# Patient Record
Sex: Male | Born: 2000 | Race: Black or African American | Hispanic: No | Marital: Single | Smoking: Current some day smoker
Health system: Southern US, Community
[De-identification: ages and names within clinical notes are randomized; demographics above are authoritative.]

## PROBLEM LIST (undated history)

## (undated) DIAGNOSIS — J45909 Unspecified asthma, uncomplicated: Secondary | ICD-10-CM

---
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
## 2019-10-11 ENCOUNTER — Emergency Department (HOSPITAL_BASED_OUTPATIENT_CLINIC_OR_DEPARTMENT_OTHER)
Admission: EM | Admit: 2019-10-11 | Discharge: 2019-10-11 | Disposition: A | Discharge status: Home / Self Care | Payer: Medicaid Other | Attending: Emergency Medicine | Admitting: Emergency Medicine

## 2019-10-11 ENCOUNTER — Encounter (HOSPITAL_BASED_OUTPATIENT_CLINIC_OR_DEPARTMENT_OTHER): Payer: Self-pay | Admitting: Emergency Medicine

## 2019-10-11 ENCOUNTER — Other Ambulatory Visit: Payer: Self-pay

## 2019-10-11 DIAGNOSIS — J3489 Other specified disorders of nose and nasal sinuses: Secondary | ICD-10-CM | POA: Insufficient documentation

## 2019-10-11 DIAGNOSIS — R0602 Shortness of breath: Secondary | ICD-10-CM | POA: Diagnosis present

## 2019-10-11 DIAGNOSIS — R0981 Nasal congestion: Secondary | ICD-10-CM | POA: Insufficient documentation

## 2019-10-11 DIAGNOSIS — F17290 Nicotine dependence, other tobacco product, uncomplicated: Secondary | ICD-10-CM | POA: Insufficient documentation

## 2019-10-11 HISTORY — DX: Unspecified asthma, uncomplicated: J45.909

## 2019-10-11 NOTE — ED Provider Notes (Signed)
[facility] EMERGENCY DEPARTMENT Provider Note  CSN: [id number] Arrival date & time: 10/11/19 0111  Chief Complaint(s) Shortness of Breath  HPI Carlos Maxwell is a 19 y.o. male   HPI CC: trouble breathing  Onset/Duration: 2 days Timing: usually at night while lying down Location: nose Severity: moderate Modifying Factors:  Improved by: breathing through mouth, sitting up  Worsened by: lying down Associated Signs/Symptoms:  Pertinent (+): nasal congestion, post nasal drip  Pertinent (-): fevers, chills, wheezing, cough,  Context: no sick contacts  Past Medical History Past Medical History:  Diagnosis Date  . Asthma    There are no problems to display for this patient.  Home Medication(s) Prior to Admission medications   Not on File                                                                                                                                    Past Surgical History History reviewed. No pertinent surgical history. Family History Family History  Problem Relation Age of Onset  . Hypertension Mother   . Colon cancer Maternal Grandmother     Social History Social History   Tobacco Use  . Smoking status: Current Some Day Smoker    Types: Cigars  . Smokeless tobacco: Never Used  Substance Use Topics  . Alcohol use: Never  . Drug use: Never   Allergies Patient has no known allergies.  Review of Systems Review of Systems All other systems are reviewed and are negative for acute change except as noted in the HPI  Physical Exam Vital Signs  I have reviewed the triage vital signs BP 116/80 (BP Location: Left Arm)   Pulse 82   Temp 98.1 F (36.7 C) (Oral)   Resp 16   Ht 5\' 9"  (1.753 m)   Wt 117.5 kg   SpO2 100%   BMI 38.25 kg/m   Physical Exam Vitals reviewed.  Constitutional:      General: He is not in acute distress.    Appearance: He is well-developed. He is not diaphoretic.  HENT:     Head: Normocephalic and  atraumatic.     Nose: Mucosal edema, congestion and rhinorrhea present.     Mouth/Throat:     Mouth: No angioedema.     Pharynx: No pharyngeal swelling, posterior oropharyngeal erythema or uvula swelling.     Tonsils: No tonsillar exudate.     Comments: Post nasal drip Eyes:     General: No scleral icterus.       Right eye: No discharge.        Left eye: No discharge.     Conjunctiva/sclera: Conjunctivae normal.     Pupils: Pupils are equal, round, and reactive to light.  Cardiovascular:     Rate and Rhythm: Normal rate and regular rhythm.     Heart sounds: No murmur. No friction rub. No gallop.   Pulmonary:  Effort: Pulmonary effort is normal. No respiratory distress.     Breath sounds: Normal breath sounds. No stridor. No rales.  Abdominal:     General: There is no distension.     Palpations: Abdomen is soft.     Tenderness: There is no abdominal tenderness.  Musculoskeletal:        General: No tenderness.     Cervical back: Normal range of motion and neck supple.  Skin:    General: Skin is warm and dry.     Findings: No erythema or rash.  Neurological:     Mental Status: He is alert and oriented to person, place, and time.     ED Results and Treatments Labs (all labs ordered are listed, but only abnormal results are displayed) Labs Reviewed - No data to display                                                                                                                       EKG  EKG Interpretation  Date/Time:    Ventricular Rate:    PR Interval:    QRS Duration:   QT Interval:    QTC Calculation:   R Axis:     Text Interpretation:        Radiology No results found.  Pertinent labs & imaging results that were available during my care of the patient were reviewed by me and considered in my medical decision making (see chart for details).  Medications Ordered in ED Medications - No data to display                                                                                                                                   Procedures Procedures  (including critical care time)  Medical Decision Making / ED Course I have reviewed the nursing notes for this encounter and the patient's prior records (if available in EHR or on provided paperwork).   Carlos Maxwell was evaluated in Emergency Department on 10/11/2019 for the symptoms described in the history of present illness. He was evaluated in the context of the global COVID-19 pandemic, which necessitated consideration that the patient might be at risk for infection with the SARS-CoV-2 virus that causes COVID-19. Institutional protocols and algorithms that pertain to the evaluation of patients at risk for COVID-19 are in a state of rapid change based on information released by regulatory bodies including the CDC  and federal and state organizations. These policies and algorithms were followed during the patient's care in the ED.  Trouble breathing through nose at night. Has nasal congestion, likely allergic. No other infectious symptoms. Lungs clear. Recommended OTC allergy medication including nasal steroid spray.      Final Clinical Impression(s) / ED Diagnoses Final diagnoses:  Nasal congestion    The patient appears reasonably screened and/or stabilized for discharge and I doubt any other medical condition or other [facility] requiring further screening, evaluation, or treatment in the ED at this time prior to discharge. Safe for discharge with strict return precautions.  Disposition: Discharge  Condition: Good  I have discussed the results, Dx and Tx plan with the patient/family who expressed understanding and agree(s) with the plan. Discharge instructions discussed at length. The patient/family was given strict return precautions who verbalized understanding of the instructions. No further questions at time of discharge.    ED Discharge Orders    None        Follow  Up: Endocrinology, [facility] [address] Dr Laurell Josephs [address] [place] [address] [phone]  Schedule an appointment as soon as possible for a visit  As needed     This chart was dictated using voice recognition software.  Despite best efforts to proofread,  errors can occur which can change the documentation meaning.   Nira Conn, MD 10/11/19 [phone]

## 2019-10-11 NOTE — ED Triage Notes (Signed)
Pt states he goes to sleep and feels fine but when he wakes up he has shortness of breath  Pt also is c/o rash under his chin from wearing his mask  Mother states she has given him Tylenol cold and flu  Pt has hx of asthma

## 2020-02-28 ENCOUNTER — Encounter (HOSPITAL_BASED_OUTPATIENT_CLINIC_OR_DEPARTMENT_OTHER): Payer: Self-pay

## 2020-02-28 ENCOUNTER — Emergency Department (HOSPITAL_BASED_OUTPATIENT_CLINIC_OR_DEPARTMENT_OTHER)
Admission: EM | Admit: 2020-02-28 | Discharge: 2020-02-28 | Disposition: A | Discharge status: Home / Self Care | Payer: Medicaid Other | Attending: Emergency Medicine | Admitting: Emergency Medicine

## 2020-02-28 ENCOUNTER — Other Ambulatory Visit: Payer: Self-pay

## 2020-02-28 DIAGNOSIS — Z87891 Personal history of nicotine dependence: Secondary | ICD-10-CM | POA: Diagnosis not present

## 2020-02-28 DIAGNOSIS — Z20822 Contact with and (suspected) exposure to covid-19: Secondary | ICD-10-CM | POA: Insufficient documentation

## 2020-02-28 DIAGNOSIS — Z202 Contact with and (suspected) exposure to infections with a predominantly sexual mode of transmission: Secondary | ICD-10-CM | POA: Insufficient documentation

## 2020-02-28 DIAGNOSIS — R059 Cough, unspecified: Secondary | ICD-10-CM | POA: Diagnosis present

## 2020-02-28 DIAGNOSIS — L309 Dermatitis, unspecified: Secondary | ICD-10-CM | POA: Diagnosis not present

## 2020-02-28 DIAGNOSIS — J4521 Mild intermittent asthma with (acute) exacerbation: Secondary | ICD-10-CM | POA: Insufficient documentation

## 2020-02-28 LAB — SARS CORONAVIRUS 2 BY RT PCR (HOSPITAL ORDER, PERFORMED IN ~~LOC~~ HOSPITAL LAB): SARS Coronavirus 2: NEGATIVE

## 2020-02-28 MED ORDER — PREDNISONE 50 MG PO TABS
60.0000 mg | ORAL_TABLET | Freq: Once | ORAL | Status: AC
  Administered 2020-02-28: 60 mg via ORAL
  Filled 2020-02-28: qty 1

## 2020-02-28 MED ORDER — HYDROCORTISONE 2.5 % EX LOTN: TOPICAL_LOTION | Freq: Two times a day (BID) | CUTANEOUS | 0 refills | Status: DC

## 2020-02-28 MED ORDER — PREDNISONE 20 MG PO TABS: ORAL_TABLET | ORAL | 0 refills | Status: DC

## 2020-02-28 MED ORDER — AZITHROMYCIN 1 G PO PACK
1.0000 g | PACK | Freq: Once | ORAL | Status: AC
  Administered 2020-02-28: 1 g via ORAL
  Filled 2020-02-28: qty 1

## 2020-02-28 MED ORDER — ALBUTEROL SULFATE HFA 108 (90 BASE) MCG/ACT IN AERS
2.0000 | INHALATION_SPRAY | Freq: Once | RESPIRATORY_TRACT | Status: AC
  Administered 2020-02-28: 2 via RESPIRATORY_TRACT
  Filled 2020-02-28: qty 6.7

## 2020-02-28 NOTE — ED Provider Notes (Signed)
[facility] EMERGENCY DEPARTMENT Provider Note   CSN: [id number] Arrival date & time: 02/28/20  2016     History Chief Complaint  Patient presents with  . Cough    Carlos Maxwell is a 19 y.o. male hx of asthma here presenting with multiple complaints.  Patient states that he ran out of his albuterol several weeks ago and has been having nonproductive cough and wheezing for the last week.  Patient states that he is vaccinated with [facility] vaccine and has no Covid exposure.  Patient also states that he needs to get tested for chlamydia.  He states that his girlfriend was diagnosed with chlamydia and was treated but he is concerned that he may have as well.  He denies any penile discharge or dysuria or penile rash.  Finally, patient states that the right side of his beard appears abnormal for the last year or so.  He states that the skin just has not grow back normally.  The history is provided by the patient.       Past Medical History:  Diagnosis Date  . Asthma     There are no problems to display for this patient.   History reviewed. No pertinent surgical history.     Family History  Problem Relation Age of Onset  . Hypertension Mother   . Colon cancer Maternal Grandmother     Social History   Tobacco Use  . Smoking status: Former Smoker    Types: Cigars  . Smokeless tobacco: Never Used  Vaping Use  . Vaping Use: Every day  Substance Use Topics  . Alcohol use: Never  . Drug use: Never    Home Medications Prior to Admission medications   Not on File    Allergies    Patient has no known allergies.  Review of Systems   Review of Systems  Respiratory: Positive for cough.   Skin: Positive for rash.  All other systems reviewed and are negative.   Physical Exam Updated Vital Signs BP 112/66 (BP Location: Left Arm)   Pulse 94   Temp 98.2 F (36.8 C) (Oral)   Resp 18   Ht 5\' 8"  (1.727 m)   Wt 124.3 kg   SpO2 98%   BMI 41.66  kg/m   Physical Exam Vitals and nursing note reviewed.  HENT:     Head: Normocephalic.     Nose: Nose normal.     Mouth/Throat:     Mouth: Mucous membranes are moist.  Eyes:     Extraocular Movements: Extraocular movements intact.     Pupils: Pupils are equal, round, and reactive to light.  Neck:     Comments: Right side beard with evidence of eczema.  No obvious ringworm. Cardiovascular:     Rate and Rhythm: Normal rate and regular rhythm.     Pulses: Normal pulses.     Heart sounds: Normal heart sounds.  Pulmonary:     Comments: Diminished throughout and no wheezing or crackles Abdominal:     General: Abdomen is flat.     Palpations: Abdomen is soft.  Musculoskeletal:        General: Normal range of motion.     Cervical back: Normal range of motion.  Skin:    General: Skin is warm.     Capillary Refill: Capillary refill takes less than 2 seconds.  Neurological:     General: No focal deficit present.     Mental Status: He is alert and oriented  to person, place, and time.  Psychiatric:        Mood and Affect: Mood normal.        Behavior: Behavior normal.     ED Results / Procedures / Treatments   Labs (all labs ordered are listed, but only abnormal results are displayed) Labs Reviewed  SARS CORONAVIRUS 2 BY RT PCR (HOSPITAL ORDER, PERFORMED IN [facility])  GC/CHLAMYDIA PROBE AMP ([facility]) NOT AT [facility]    EKG None  Radiology No results found.  Procedures Procedures (including critical care time)  Medications Ordered in ED Medications  albuterol (VENTOLIN HFA) 108 (90 Base) MCG/ACT inhaler 2 puff (has no administration in time range)  predniSONE (DELTASONE) tablet 60 mg (has no administration in time range)  azithromycin (ZITHROMAX) powder 1 g (has no administration in time range)    ED Course  I have reviewed the triage vital signs and the nursing notes.  Pertinent labs & imaging results that were available during my care of the  patient were reviewed by me and considered in my medical decision making (see chart for details).    MDM Rules/Calculators/A&P                         Carlos Maxwell is a 19 y.o. male with multiple complaints.  Patient does have some wheezing and cough.  Patient is not hypoxic and has no Covid exposure.  I think likely mild asthma exacerbation so plan to give a course of steroids and albuterol.  We will also get outpatient testing for Covid.  He also has possible chlamydia exposure so we will give azithromycin and test for urine GC chlamydia.  Finally he has some eczema on the right side of the beard and I will give a course of hydrocortisone.   Final Clinical Impression(s) / ED Diagnoses Final diagnoses:  None    Rx / DC Orders ED Discharge Orders    None       Charlynne Pander, MD 02/28/20 2101

## 2020-02-28 NOTE — Discharge Instructions (Signed)
Take prednisone as prescribed.  Use albuterol every 4 hours as needed for cough  You were treated for chlamydia.  Please wear a condom  Use hydrocortisone cream to your beard twice daily to help with eczema  See your doctor for follow  You were tested for Covid and if you are positive you will need to stay home for 10 days  Return to ER if you have worse trouble breathing, shortness of breath, fever.

## 2020-02-28 NOTE — ED Triage Notes (Signed)
Pt c/o cough, wheezing x 1 week-last used yesterday-NAD-steady gait

## 2020-02-29 LAB — GC/CHLAMYDIA PROBE AMP (~~LOC~~) NOT AT ARMC
Chlamydia: POSITIVE — AB
Comment: NEGATIVE
Comment: NORMAL
Neisseria Gonorrhea: NEGATIVE

## 2020-12-01 ENCOUNTER — Encounter (HOSPITAL_BASED_OUTPATIENT_CLINIC_OR_DEPARTMENT_OTHER): Payer: Self-pay

## 2020-12-01 ENCOUNTER — Other Ambulatory Visit: Payer: Self-pay

## 2020-12-01 ENCOUNTER — Emergency Department (HOSPITAL_BASED_OUTPATIENT_CLINIC_OR_DEPARTMENT_OTHER)
Admission: EM | Admit: 2020-12-01 | Discharge: 2020-12-01 | Disposition: A | Discharge status: Home / Self Care | Payer: Medicaid Other | Attending: Emergency Medicine | Admitting: Emergency Medicine

## 2020-12-01 DIAGNOSIS — K0889 Other specified disorders of teeth and supporting structures: Secondary | ICD-10-CM | POA: Diagnosis present

## 2020-12-01 DIAGNOSIS — J45909 Unspecified asthma, uncomplicated: Secondary | ICD-10-CM | POA: Diagnosis not present

## 2020-12-01 DIAGNOSIS — Z87891 Personal history of nicotine dependence: Secondary | ICD-10-CM | POA: Diagnosis not present

## 2020-12-01 DIAGNOSIS — K029 Dental caries, unspecified: Secondary | ICD-10-CM | POA: Diagnosis not present

## 2020-12-01 MED ORDER — AMOXICILLIN 500 MG PO CAPS: 500.0000 mg | ORAL_CAPSULE | Freq: Three times a day (TID) | ORAL | 0 refills | Status: DC

## 2020-12-01 NOTE — ED Provider Notes (Signed)
[facility] EMERGENCY DEPARTMENT Provider Note   CSN: [id number] Arrival date & time: 12/01/20  1324     History Chief Complaint  Patient presents with   Dental Pain    Left lower since past 3 days    Carlos Maxwell is a 20 y.o. male.  The history is provided by the patient.  Dental Pain Location:  Lower Lower teeth location:  17/LL 3rd molar Quality:  Throbbing Severity:  Moderate Onset quality:  Gradual Duration:  3 days Timing:  Constant Progression:  Worsening Chronicity:  New Context: poor dentition   Relieved by:  Nothing Worsened by:  Touching and jaw movement Ineffective treatments:  NSAIDs Associated symptoms: facial pain   Associated symptoms: no fever, no neck pain and no neck swelling       Past Medical History:  Diagnosis Date   Asthma     There are no problems to display for this patient.   History reviewed. No pertinent surgical history.     Family History  Problem Relation Age of Onset   Hypertension Mother    Colon cancer Maternal Grandmother     Social History   Tobacco Use   Smoking status: Former    Types: Cigars   Smokeless tobacco: Never  Vaping Use   Vaping Use: Every day  Substance Use Topics   Alcohol use: Never   Drug use: Never    Home Medications Prior to Admission medications   Medication Sig Start Date End Date Taking? Authorizing Provider  amoxicillin (AMOXIL) 500 MG capsule Take 1 capsule (500 mg total) by mouth 3 (three) times daily. 12/01/20  Yes Koleen Distance, MD  hydrocortisone 2.5 % lotion Apply topically 2 (two) times daily. 02/28/20   Charlynne Pander, MD    Allergies    Patient has no known allergies.  Review of Systems   Review of Systems  Constitutional:  Negative for chills and fever.  HENT:  Negative for ear pain and sore throat.   Eyes:  Negative for pain and visual disturbance.  Respiratory:  Negative for cough and shortness of breath.   Cardiovascular:  Negative for chest  pain and palpitations.  Gastrointestinal:  Negative for abdominal pain and vomiting.  Genitourinary:  Negative for dysuria and hematuria.  Musculoskeletal:  Negative for arthralgias, back pain and neck pain.  Skin:  Negative for color change and rash.  Neurological:  Negative for seizures and syncope.  All other systems reviewed and are negative.  Physical Exam Updated Vital Signs BP 113/68 (BP Location: Right Arm)   Pulse 80   Temp 97.7 F (36.5 C) (Oral)   Resp 18   Ht 6' (1.829 m)   Wt 124.3 kg   SpO2 99%   BMI 37.16 kg/m   Physical Exam Vitals and nursing note reviewed.  Constitutional:      Appearance: Normal appearance.  HENT:     Head: Normocephalic and atraumatic.     Mouth/Throat:      Comments: Diffuse gingival erythema and swelling especially adjacent to the left lower third molar.  No focal abscess or drainable fluid collection. Eyes:     Conjunctiva/sclera: Conjunctivae normal.  Pulmonary:     Effort: Pulmonary effort is normal. No respiratory distress.  Musculoskeletal:        General: No deformity. Normal range of motion.     Cervical back: Normal range of motion.  Skin:    General: Skin is warm and dry.  Neurological:  General: No focal deficit present.     Mental Status: He is alert and oriented to person, place, and time. Mental status is at baseline.  Psychiatric:        Mood and Affect: Mood normal.    ED Results / Procedures / Treatments   Labs (all labs ordered are listed, but only abnormal results are displayed) Labs Reviewed - No data to display  EKG None  Radiology No results found.  Procedures Procedures   Medications Ordered in ED Medications - No data to display  ED Course  I have reviewed the triage vital signs and the nursing notes.  Pertinent labs & imaging results that were available during my care of the patient were reviewed by me and considered in my medical decision making (see chart for details).    MDM  Rules/Calculators/A&P                           Loney Hering was given amoxicillin for dental caries and gingivitis.  He was referred to dentistry.  Return precautions were discussed. Final Clinical Impression(s) / ED Diagnoses Final diagnoses:  Pain due to dental caries    Rx / DC Orders ED Discharge Orders          Ordered    amoxicillin (AMOXIL) 500 MG capsule  3 times daily        12/01/20 0945             Koleen Distance, MD 12/01/20 [phone]

## 2020-12-11 ENCOUNTER — Other Ambulatory Visit: Payer: Self-pay

## 2020-12-11 ENCOUNTER — Encounter (HOSPITAL_BASED_OUTPATIENT_CLINIC_OR_DEPARTMENT_OTHER): Payer: Self-pay | Admitting: Emergency Medicine

## 2020-12-11 ENCOUNTER — Emergency Department (HOSPITAL_BASED_OUTPATIENT_CLINIC_OR_DEPARTMENT_OTHER)
Admission: EM | Admit: 2020-12-11 | Discharge: 2020-12-11 | Disposition: A | Discharge status: Home / Self Care | Payer: Medicaid Other | Attending: Emergency Medicine | Admitting: Emergency Medicine

## 2020-12-11 DIAGNOSIS — J069 Acute upper respiratory infection, unspecified: Secondary | ICD-10-CM | POA: Insufficient documentation

## 2020-12-11 DIAGNOSIS — Z20822 Contact with and (suspected) exposure to covid-19: Secondary | ICD-10-CM | POA: Insufficient documentation

## 2020-12-11 DIAGNOSIS — F17290 Nicotine dependence, other tobacco product, uncomplicated: Secondary | ICD-10-CM | POA: Insufficient documentation

## 2020-12-11 DIAGNOSIS — J45909 Unspecified asthma, uncomplicated: Secondary | ICD-10-CM | POA: Diagnosis not present

## 2020-12-11 DIAGNOSIS — R059 Cough, unspecified: Secondary | ICD-10-CM | POA: Diagnosis present

## 2020-12-11 LAB — RESP PANEL BY RT-PCR (FLU A&B, COVID) ARPGX2
Influenza A by PCR: NEGATIVE
Influenza B by PCR: NEGATIVE
SARS Coronavirus 2 by RT PCR: NEGATIVE

## 2020-12-11 NOTE — Discharge Instructions (Signed)
Home to quarantine pending COVID test results.  Your results will be available in the next 2 hours in your MyChart account.  If your COVID test is positive, you should quarantine as directed.  If your test is negative, recheck with your doctor if not improving.

## 2020-12-11 NOTE — ED Triage Notes (Signed)
Pt reports nasal congestion, fever and body aches for the last 4 days; denies any sick contacts

## 2020-12-11 NOTE — ED Provider Notes (Signed)
[facility] EMERGENCY DEPARTMENT Provider Note   CSN: [id number] Arrival date & time: 12/11/20  2039     History Chief Complaint  Patient presents with   Nasal Congestion    Carlos Maxwell is a 20 y.o. male.  20 year old male presents with complaint of cough, congestion, body aches and fever for the past 4 days.  No known sick contacts.  History of asthma, not having to use his inhaler.  No other complaints or concerns.  Carlos Maxwell was evaluated in Emergency Department on 12/11/2020 for the symptoms described in the history of present illness. He was evaluated in the context of the global COVID-19 pandemic, which necessitated consideration that the patient might be at risk for infection with the SARS-CoV-2 virus that causes COVID-19. Institutional protocols and algorithms that pertain to the evaluation of patients at risk for COVID-19 are in a state of rapid change based on information released by regulatory bodies including the CDC and federal and state organizations. These policies and algorithms were followed during the patient's care in the ED.       Past Medical History:  Diagnosis Date   Asthma     There are no problems to display for this patient.   History reviewed. No pertinent surgical history.     Family History  Problem Relation Age of Onset   Hypertension Mother    Colon cancer Maternal Grandmother     Social History   Tobacco Use   Smoking status: Some Days    Types: Cigars   Smokeless tobacco: Never  Vaping Use   Vaping Use: Every day  Substance Use Topics   Alcohol use: Never   Drug use: Never    Home Medications Prior to Admission medications   Medication Sig Start Date End Date Taking? Authorizing Provider  amoxicillin (AMOXIL) 500 MG capsule Take 1 capsule (500 mg total) by mouth 3 (three) times daily. 12/01/20   Koleen Distance, MD  hydrocortisone 2.5 % lotion Apply topically 2 (two) times daily. 02/28/20   Charlynne Pander, MD    Allergies    Patient has no known allergies.  Review of Systems   Review of Systems  Constitutional:  Positive for fever. Negative for chills.  HENT:  Positive for congestion and sore throat.   Respiratory:  Positive for cough. Negative for shortness of breath and wheezing.   Cardiovascular:  Negative for chest pain.  Gastrointestinal:  Negative for diarrhea, nausea and vomiting.  Musculoskeletal:  Positive for arthralgias and myalgias.  Skin:  Negative for rash and wound.  Allergic/Immunologic: Negative for immunocompromised state.  Neurological:  Negative for weakness.  Hematological:  Negative for adenopathy.  Psychiatric/Behavioral:  Negative for confusion.   All other systems reviewed and are negative.  Physical Exam Updated Vital Signs BP 134/79 (BP Location: Left Arm)   Pulse 94   Temp 99 F (37.2 C) (Oral)   Resp 19   Ht 6' (1.829 m)   Wt 132.9 kg   SpO2 97%   BMI 39.74 kg/m   Physical Exam Vitals and nursing note reviewed.  Constitutional:      General: He is not in acute distress.    Appearance: He is well-developed. He is not diaphoretic.  HENT:     Head: Normocephalic and atraumatic.     Right Ear: Tympanic membrane and ear canal normal.     Left Ear: Tympanic membrane and ear canal normal.     Nose: Congestion present.  Mouth/Throat:     Mouth: Mucous membranes are moist.     Pharynx: No oropharyngeal exudate or posterior oropharyngeal erythema.  Eyes:     Conjunctiva/sclera: Conjunctivae normal.  Cardiovascular:     Rate and Rhythm: Normal rate and regular rhythm.     Pulses: Normal pulses.     Heart sounds: Normal heart sounds.  Pulmonary:     Effort: Pulmonary effort is normal.     Breath sounds: Normal breath sounds.  Musculoskeletal:     Cervical back: Neck supple.  Lymphadenopathy:     Cervical: No cervical adenopathy.  Skin:    General: Skin is warm and dry.     Findings: No erythema or rash.  Neurological:      Mental Status: He is alert and oriented to person, place, and time.  Psychiatric:        Behavior: Behavior normal.    ED Results / Procedures / Treatments   Labs (all labs ordered are listed, but only abnormal results are displayed) Labs Reviewed  RESP PANEL BY RT-PCR (FLU A&B, COVID) [id number]    EKG None  Radiology No results found.  Procedures Procedures   Medications Ordered in ED Medications - No data to display  ED Course  I have reviewed the triage vital signs and the nursing notes.  Pertinent labs & imaging results that were available during my care of the patient were reviewed by me and considered in my medical decision making (see chart for details).  Clinical Course as of 12/11/20 2209  Tue Dec 11, 2020  1129 20 year old male with complaint of URI symptoms as above.  History of asthma but not needing to use his inhaler currently, does not need refill.  Exam is unremarkable with exception of mild nasal congestion.  Vitals reassuring including O2 sat 97% on room air. Advised to quarantine pending COVID test result, discussed symptomatic management at home. [LM]    Clinical Course User Index [LM] Alden Hipp   MDM Rules/Calculators/A&P                           Final Clinical Impression(s) / ED Diagnoses Final diagnoses:  Viral URI with cough    Rx / DC Orders ED Discharge Orders     None        Alden Hipp 12/11/20 2209    Jacalyn Lefevre, MD 12/11/20 [phone]

## 2021-03-04 ENCOUNTER — Other Ambulatory Visit: Payer: Self-pay

## 2021-03-04 ENCOUNTER — Encounter (HOSPITAL_BASED_OUTPATIENT_CLINIC_OR_DEPARTMENT_OTHER): Payer: Self-pay | Admitting: Emergency Medicine

## 2021-03-04 DIAGNOSIS — Z711 Person with feared health complaint in whom no diagnosis is made: Secondary | ICD-10-CM | POA: Diagnosis not present

## 2021-03-04 DIAGNOSIS — J45909 Unspecified asthma, uncomplicated: Secondary | ICD-10-CM | POA: Insufficient documentation

## 2021-03-04 DIAGNOSIS — Z20822 Contact with and (suspected) exposure to covid-19: Secondary | ICD-10-CM | POA: Insufficient documentation

## 2021-03-04 DIAGNOSIS — F17290 Nicotine dependence, other tobacco product, uncomplicated: Secondary | ICD-10-CM | POA: Insufficient documentation

## 2021-03-04 DIAGNOSIS — R059 Cough, unspecified: Secondary | ICD-10-CM | POA: Diagnosis present

## 2021-03-04 DIAGNOSIS — J101 Influenza due to other identified influenza virus with other respiratory manifestations: Secondary | ICD-10-CM | POA: Diagnosis not present

## 2021-03-04 NOTE — ED Triage Notes (Signed)
Pt c/o cough with chest congestion x 1 week

## 2021-03-05 ENCOUNTER — Emergency Department (HOSPITAL_BASED_OUTPATIENT_CLINIC_OR_DEPARTMENT_OTHER): Payer: Medicaid Other

## 2021-03-05 ENCOUNTER — Emergency Department (HOSPITAL_BASED_OUTPATIENT_CLINIC_OR_DEPARTMENT_OTHER)
Admission: EM | Admit: 2021-03-05 | Discharge: 2021-03-05 | Disposition: A | Discharge status: Home / Self Care | Payer: Medicaid Other | Attending: Emergency Medicine | Admitting: Emergency Medicine

## 2021-03-05 DIAGNOSIS — Z711 Person with feared health complaint in whom no diagnosis is made: Secondary | ICD-10-CM

## 2021-03-05 DIAGNOSIS — J111 Influenza due to unidentified influenza virus with other respiratory manifestations: Secondary | ICD-10-CM

## 2021-03-05 LAB — RESP PANEL BY RT-PCR (FLU A&B, COVID) ARPGX2
Influenza A by PCR: POSITIVE — AB
Influenza B by PCR: NEGATIVE
SARS Coronavirus 2 by RT PCR: NEGATIVE

## 2021-03-05 MED ORDER — OSELTAMIVIR PHOSPHATE 75 MG PO CAPS
75.0000 mg | ORAL_CAPSULE | Freq: Once | ORAL | Status: AC
  Administered 2021-03-05: 75 mg via ORAL
  Filled 2021-03-05: qty 1

## 2021-03-05 MED ORDER — OSELTAMIVIR PHOSPHATE 75 MG PO CAPS: 75.0000 mg | ORAL_CAPSULE | Freq: Two times a day (BID) | ORAL | 0 refills | Status: AC

## 2021-03-05 MED ORDER — DEXAMETHASONE SODIUM PHOSPHATE 10 MG/ML IJ SOLN
10.0000 mg | Freq: Once | INTRAMUSCULAR | Status: AC
  Administered 2021-03-05: 10 mg via INTRAMUSCULAR
  Filled 2021-03-05: qty 1

## 2021-03-05 MED ORDER — ALBUTEROL SULFATE HFA 108 (90 BASE) MCG/ACT IN AERS: 2.0000 | INHALATION_SPRAY | RESPIRATORY_TRACT | 0 refills | Status: AC | PRN

## 2021-03-05 NOTE — ED Provider Notes (Signed)
[facility] Provider Note: Lowella Dell, MD, FACEP  CSN: [id number] MRN: [id number] ARRIVAL: 03/04/21 at 2132 ROOM: [facility]   CHIEF COMPLAINT  Cough   HISTORY OF PRESENT ILLNESS  03/05/21 1:13 AM Carlos Maxwell is a 20 y.o. male who has had 2 weeks of nasal congestion.  Yesterday he developed a cough with shortness of breath, sore throat, subjective fever and body aches.  He rates his throat pain is an 8 out of 10.  Is worse with swallowing.  He has a history of asthma and does have an inhaler although he would like a refill.    Past Medical History:  Diagnosis Date   Asthma     History reviewed. No pertinent surgical history.  Family History  Problem Relation Age of Onset   Hypertension Mother    Colon cancer Maternal Grandmother     Social History   Tobacco Use   Smoking status: Some Days    Types: Cigars   Smokeless tobacco: Never  Vaping Use   Vaping Use: Every day  Substance Use Topics   Alcohol use: Never   Drug use: Never    Prior to Admission medications   Medication Sig Start Date End Date Taking? Authorizing Provider  albuterol (VENTOLIN HFA) 108 (90 Base) MCG/ACT inhaler Inhale 2 puffs into the lungs every 4 (four) hours as needed for wheezing or shortness of breath. 03/05/21  Yes Hagop Mccollam, MD  oseltamivir (TAMIFLU) 75 MG capsule Take 1 capsule (75 mg total) by mouth every 12 (twelve) hours. 03/05/21  Yes Athleen Feltner, MD    Allergies Patient has no known allergies.   REVIEW OF SYSTEMS  Negative except as noted here or in the History of Present Illness.   PHYSICAL EXAMINATION  Initial Vital Signs Blood pressure 140/85, pulse 95, temperature 99.2 F (37.3 C), temperature source Oral, resp. rate 16, height 6' (1.829 m), weight 132.9 kg, SpO2 99 %.  Examination General: Well-developed, well-nourished male in no acute distress; appearance consistent with age of record HENT: normocephalic; atraumatic; no pharyngeal erythema  or exudate; uvula midline; no dysphonia Eyes: Normal appearance Neck: supple Heart: regular rate and rhythm Lungs: Stridor Abdomen: soft; nondistended; nontender; bowel sounds present Extremities: No deformity; full range of motion; pulses normal Neurologic: Awake, alert and oriented; motor function intact in all extremities and symmetric; no facial droop Skin: Warm and dry Psychiatric: Normal mood and affect   RESULTS  Summary of this visit's results, reviewed and interpreted by myself:   EKG Interpretation  Date/Time:    Ventricular Rate:    PR Interval:    QRS Duration:   QT Interval:    QTC Calculation:   R Axis:     Text Interpretation:         Laboratory Studies: No results found for this or any previous visit (from the past 24 hour(s)). Imaging Studies: No results found.  ED COURSE and MDM  Nursing notes, initial and subsequent vitals signs, including pulse oximetry, reviewed and interpreted by myself.  Vitals:   03/04/21 2159 03/04/21 2203 03/05/21 0209  BP:  140/85 135/85  Pulse:  95 92  Resp:  16 15  Temp:  99.2 F (37.3 C) 99 F (37.2 C)  TempSrc:  Oral Oral  SpO2:  99% 99%  Weight: 132.9 kg    Height: 6' (1.829 m)     Medications  dexamethasone (DECADRON) injection 10 mg (has no administration in time range)  oseltamivir (TAMIFLU) capsule 75 mg (has  no administration in time range)   Patient's presentation is concerning for influenza A which is endemic in the area currently.  Definitive test is pending.  We will start him on Tamiflu but advised to discontinue Tamiflu if his influenza test is negative.  We will also give him a dose of dexamethasone to help with his sore throat.  He is requesting a refill of his albuterol inhaler which we will provide.  He is also requesting chlamydia test as he tested positive for, and was treated for come chlamydia a year ago but is having urethral itching now.   PROCEDURES  Procedures   ED DIAGNOSES      ICD-10-CM   1. Influenza-like illness  J11.1     2. Concern about sexually transmitted disease in male without diagnosis  Z71.1          Laekyn Rayos, MD 03/05/21 [phone]

## 2021-03-06 LAB — GC/CHLAMYDIA PROBE AMP (~~LOC~~) NOT AT ARMC
Chlamydia: NEGATIVE
Comment: NEGATIVE
Comment: NORMAL
Neisseria Gonorrhea: NEGATIVE

## 2021-06-26 ENCOUNTER — Other Ambulatory Visit: Payer: Self-pay

## 2021-06-26 ENCOUNTER — Encounter (HOSPITAL_BASED_OUTPATIENT_CLINIC_OR_DEPARTMENT_OTHER): Payer: Self-pay | Admitting: Emergency Medicine

## 2021-06-26 ENCOUNTER — Emergency Department (HOSPITAL_BASED_OUTPATIENT_CLINIC_OR_DEPARTMENT_OTHER)
Admission: EM | Admit: 2021-06-26 | Discharge: 2021-06-26 | Discharge status: Against Medical Advice | Payer: Medicaid Other | Attending: Emergency Medicine | Admitting: Emergency Medicine

## 2021-06-26 DIAGNOSIS — T171XXA Foreign body in nostril, initial encounter: Secondary | ICD-10-CM | POA: Insufficient documentation

## 2021-06-26 DIAGNOSIS — X58XXXA Exposure to other specified factors, initial encounter: Secondary | ICD-10-CM | POA: Diagnosis not present

## 2021-06-26 DIAGNOSIS — Z5321 Procedure and treatment not carried out due to patient leaving prior to being seen by health care provider: Secondary | ICD-10-CM | POA: Insufficient documentation

## 2021-06-26 NOTE — ED Triage Notes (Signed)
Pt states he has a bug in his right side of his nose
